# Patient Record
Sex: Female | Born: 2005
Health system: Southern US, Community
[De-identification: ages and names within clinical notes are randomized; demographics above are authoritative.]

## PROBLEM LIST (undated history)

## (undated) DIAGNOSIS — I471 Supraventricular tachycardia, unspecified: Secondary | ICD-10-CM

## (undated) DIAGNOSIS — J45909 Unspecified asthma, uncomplicated: Secondary | ICD-10-CM

## (undated) HISTORY — DX: Unspecified asthma, uncomplicated: J45.909

## (undated) HISTORY — DX: Supraventricular tachycardia, unspecified: I47.10

## (undated) HISTORY — PX: FOOT SURGERY: SHX648

## (undated) HISTORY — PX: WISDOM TOOTH EXTRACTION: SHX21

---
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
## 2023-01-08 ENCOUNTER — Ambulatory Visit (INDEPENDENT_AMBULATORY_CARE_PROVIDER_SITE_OTHER): Payer: 59 | Admitting: Licensed Clinical Social Worker

## 2023-01-08 DIAGNOSIS — F411 Generalized anxiety disorder: Secondary | ICD-10-CM | POA: Diagnosis not present

## 2023-01-08 NOTE — Progress Notes (Signed)
[facility] Counselor/Therapist Progress Note  Patient ID: Cindy Ryan, MRN: [id number]    Date: 01/08/23  Time Spent: 0905  am - 1000 am : 55 Minutes  Treatment Type: Individual Therapy.  Reported Symptoms: Anxiety  Mental Status Exam: Appearance:  Casual     Behavior: Appropriate  Motor: Normal  Speech/Language:  Clear and Coherent  Affect: Appropriate  Mood: normal  Thought process: normal  Thought content:   WNL  Sensory/Perceptual disturbances:   WNL  Orientation: oriented to person, place, time/date, situation, day of week, month of year, and year  Attention: Good  Concentration: Good  Memory: WNL  Fund of knowledge:  Good  Insight:   Good  Judgment:  Good  Impulse Control: Good   Risk Assessment: Danger to Self:  No Self-injurious Behavior: No Danger to Others: No Duty to Warn:no Physical Aggression / Violence:No  Access to Firearms a concern: No  Gang Involvement:No   Subjective:   Cindy Ryan participated from office, located at [employer] with Clinician present. Cindy Ryan consented to treatment.   Cindy Ryan presented for her session and was engaged and insightful. Patient shared her feelings about being home and  how she feels about her father and his unhinged behaviors. Patient reports that she has bottled up emotions and likely trauma from things in her childhood till now. Patient identified that when trying to talk to her family about things they minimize it say that it's the past to get over it. Clinician processed with patient her feelings about that comment and how she can begin to cope with hurt and feeling of being dismissed.    Interventions: Cognitive Behavioral Therapy/Problem Sloving  Diagnosis: Generalized Anxiety Disorder   Plan: Cindy Ryan is to use CBT, mindfulness and coping skills to help manage decrease symptoms associated with their diagnosis.   Long-term goal:   Cindy Ryan will Reduce overall level, frequency, and intensity of the  feelings of anxiety evidenced by decreased irritability, negative self talk, and helpless feelings from 6 to 7 days/week to 0 to 2 days/week per client report for at least 3 consecutive months. Treatment plan to be reviewed by 10/17/2023.  Short-term goal:  Cindy Ryan will verbally express understanding of the relationship between feelings of anxiety and their impact on thinking patterns and behaviors. Verbalize an understanding of the role that distorted thinking plays in creating fears, excessive worry, and ruminations.  Cindy Ryan MSW, LCSW DATE: 01/08/2023

## 2023-01-11 ENCOUNTER — Ambulatory Visit: Payer: 59 | Attending: Student in an Organized Health Care Education/Training Program

## 2023-01-11 DIAGNOSIS — R0602 Shortness of breath: Secondary | ICD-10-CM | POA: Insufficient documentation

## 2023-01-11 DIAGNOSIS — J4520 Mild intermittent asthma, uncomplicated: Secondary | ICD-10-CM | POA: Insufficient documentation

## 2023-01-11 LAB — PULMONARY FUNCTION TEST ARMC ONLY
DL/VA % pred: 50 %
DL/VA: 2.39 ml/min/mmHg/L
DLCO unc % pred: 71 %
DLCO unc: 16.74 ml/min/mmHg
FEF 25-75 Pre: 3.82 L/s
FEF2575-%Pred-Pre: 98 %
FEV1-%Pred-Pre: 115 %
FEV1-Pre: 4.08 L
FEV1FVC-%Pred-Pre: 94 %
FEV6-%Pred-Pre: 122 %
FEV6-Pre: 4.96 L
FEV6FVC-%Pred-Pre: 99 %
FVC-%Pred-Pre: 123 %
FVC-Pre: 4.99 L
Pre FEV1/FVC ratio: 82 %
Pre FEV6/FVC Ratio: 100 %
RV % pred: 121 %
RV: 1.53 L
TLC % pred: 109 %
TLC: 5.88 L

## 2023-01-11 MED ORDER — ALBUTEROL SULFATE (2.5 MG/3ML) 0.083% IN NEBU
2.5000 mg | INHALATION_SOLUTION | Freq: Once | RESPIRATORY_TRACT | Status: AC
  Filled 2023-01-11: qty 3

## 2023-01-12 ENCOUNTER — Encounter: Payer: Self-pay | Admitting: Cardiology

## 2023-01-12 ENCOUNTER — Ambulatory Visit: Payer: 59 | Attending: Cardiology | Admitting: Cardiology

## 2023-01-12 VITALS — BP 118/68 | HR 78 | Ht 66.0 in | Wt 132.2 lb

## 2023-01-12 DIAGNOSIS — R0602 Shortness of breath: Secondary | ICD-10-CM

## 2023-01-12 DIAGNOSIS — R002 Palpitations: Secondary | ICD-10-CM | POA: Diagnosis not present

## 2023-01-12 DIAGNOSIS — I4711 Inappropriate sinus tachycardia, so stated: Secondary | ICD-10-CM

## 2023-01-12 DIAGNOSIS — J45909 Unspecified asthma, uncomplicated: Secondary | ICD-10-CM

## 2023-01-12 DIAGNOSIS — R0789 Other chest pain: Secondary | ICD-10-CM

## 2023-01-12 MED ORDER — PROPRANOLOL HCL 10 MG PO TABS: 10.0000 mg | ORAL_TABLET | Freq: Every day | ORAL | 0 refills | Status: DC | PRN

## 2023-01-12 NOTE — Patient Instructions (Signed)
Medication Instructions:  Your physician has recommended you make the following change in your medication:   STOP - verapamil (CALAN-SR) 120 MG CR tablet  START - propranolol (INDERAL) 10 MG tablet, Daily PRN  *If you need a refill on your cardiac medications before your next appointment, please call your pharmacy*  Lab Work: Your provider would like for you to have following labs drawn today BMET & Magnesium.   If you have labs (blood work) drawn today and your tests are completely normal, you will receive your results only by: MyChart Message (if you have MyChart) OR A paper copy in the mail If you have any lab test that is abnormal or we need to change your treatment, we will call you to review the results.  Testing/Procedures: Your physician has requested that you have an echocardiogram. Echocardiography is a painless test that uses sound waves to create images of your heart. It provides your doctor with information about the size and shape of your heart and how well your heart's chambers and valves are working. This procedure takes approximately one hour. There are no restrictions for this procedure. Please do NOT wear cologne, perfume, aftershave, or lotions (deodorant is allowed). Please arrive 15 minutes prior to your appointment time.   Follow-Up: At [facility], you and your health needs are our priority.  As part of our continuing mission to provide you with exceptional heart care, we have created designated Provider Care Teams.  These Care Teams include your primary Cardiologist (physician) and Advanced Practice Providers (APPs -  Physician Assistants and Nurse Practitioners) who all work together to provide you with the care you need, when you need it.  Your next appointment:   3 month(s)  Provider:   Charlsie Quest, NP    Other Instructions - None

## 2023-01-12 NOTE — Progress Notes (Signed)
Cardiology Office Note    Date:  01/12/2023  ID:  Cindy Ryan, DOB [birth year], MRN [id number] PCP:  Lorre Munroe, NP  Cardiologist:  Debbe Odea, MD  Electrophysiologist:  None   Chief Complaint: Follow up for palpitations.   History of Present Illness: Cindy Ryan is a 17 y.o. female with visit-pertinent history of asthma and palpitations.  She was first seen by Dr. Azucena Cecil on 12/01/22 after being referred by her PCP for palpitations.  She wore a cardiac monitor in 10/2022 that showed an average heart rate of 72 bpm, ranging from 42 bpm to 200 bpm. No significant arrhythmias, no A-fib.  She had rare SVT and ventricular ectopy.  Patient reported that palpitations occasionally were worse with exertion although could occur randomly.  She had noticed heart rates up to 200 bpm.  It was also noted that she has asthma with frequent attacks sometimes exercise-induced.  She was prescribed propranolol by her PCP but patient was worried about taking a beta-blocker with airway disease and frequent asthma attack.  She was started on verapamil 120 mg daily and referred to pulmonary medicine. On 12/14/22 she notified the office of several episodes of slow heart rate, 30-44 bpm with associated fatigue. Medicine was discontinued.   Today she presents for follow-up.  She reports that she is doing well, notes ongoing intermittent palpitations and well as increased heart rate with position changes. Reports one episode lasting for a few seconds with a heart rate of 217 bpm, she is unsure of repeat episodes. She questions if she has POTs, notes she been working to increase her fluid and salt intake. When exercising she notes she has increased palpitations, shortness of breath and neck/chest aching She also endorses intermittent lightheadedness, noted more with position changes.   Labwork independently reviewed: 12/18/22: Hemoglobin 13.4, HCT 40.9  08/21/22: Potassium 4.1, sodium 142, creatinine  0.80 10/31/22: TSH 1.73  ROS: .   Today she denies chest pain, shortness of breath, lower extremity edema, fatigue, palpitations, melena, hematuria, hemoptysis, diaphoresis, weakness, presyncope, syncope, orthopnea, and PND.  All other systems are reviewed and otherwise negative. Studies Reviewed: Marland Kitchen    EKG:  EKG is not ordered today.   CV Studies:  Cardiac Studies & Procedures         MONITORS  LONG TERM MONITOR (3-14 DAYS) 11/21/2022  Narrative HR 42 - 200, average 72 bpm. Rare supraventricular and ventricular ectopy. No atrial fibrillation. No sustained arrhythmias.  Sheria Lang T. Lalla Brothers, MD, [facility], [facility] Cardiac Electrophysiology            Current Reported Medications:.    Current Meds  Medication Sig   Adapalene (DIFFERIN) 0.3 % gel Apply 1 application  topically at bedtime.   albuterol (VENTOLIN HFA) 108 (90 Base) MCG/ACT inhaler Inhale 2 puffs into the lungs every 6 (six) hours as needed.   budesonide-formoterol (SYMBICORT) 80-4.5 MCG/ACT inhaler Inhale 2 puffs into the lungs every 6 (six) hours as needed.   clindamycin (CLEOCIN-T) 1 % lotion Apply to face every morning for acne.   Dapsone (ACZONE) 7.5 % GEL Apply to face every morning.   fluticasone (FLONASE) 50 MCG/ACT nasal spray Place 2 sprays into both nostrils daily.   Fluticasone Furoate (ARNUITY ELLIPTA) 50 MCG/ACT AEPB Inhale 1 puff into the lungs daily at 12 noon.   montelukast (SINGULAIR) 10 MG tablet Take 1 tablet (10 mg total) by mouth at bedtime.   tretinoin (RETIN-A) 0.05 % cream Apply a pea-sized amount to  face nightly as tolerated.   Physical Exam:   VS:  BP 118/68 (BP Location: Left Arm, Patient Position: Sitting, Cuff Size: Normal)   Pulse 78   Ht 5\' 6"  (1.676 m)   Wt 132 lb 3.2 oz (60 kg)   LMP 12/18/2022 (Exact Date)   SpO2 100%   BMI 21.34 kg/m    Wt Readings from Last 3 Encounters:  01/12/23 132 lb 3.2 oz (60 kg) (59%, Z= 0.22)*  12/18/22 133 lb (60.3 kg) (60%, Z= 0.27)*  12/01/22 129 lb  9.6 oz (58.8 kg) (55%, Z= 0.12)*   * Growth percentiles are based on CDC (Girls, 2-20 Years) data.    GEN: Well nourished, well developed in no acute distress NECK: No JVD; No carotid bruits CARDIAC: RRR, no murmurs, rubs, gallops RESPIRATORY:  Clear to auscultation without rales, wheezing or rhonchi  ABDOMEN: Soft, non-tender, non-distended EXTREMITIES:  No edema; No acute deformity   Asessement and Plan:.   Palpitations/inappropriate sinus tachycardia/lightheadedness: She wore a cardiac monitor in 10/2022 that showed average heart rate of 72 bpm, ranging from 42 bpm to 200 bpm. No significant arrhythmias, no A-fib.  She had rare SVT and ventricular ectopy. She was started on verapamil 120 mg daily however experience episodes of bradycardia in the 30's to 40's, verapamil was discontinued. Today reports ongoing intermittent palpitations, notes short episode of heart rate 217 bpm, lasting a few seconds. Notes intermittent palpitations associated with increased fatigue and lightheadedness. She is concerned she has POTs. Orthostatics today show stable blood pressure, she did have increase in her heart rate from 76 bpm while laying to 102 bpm with standing. Discussed that with bradycardia on verapamil and her history of asthma preventing use of daily beta blocker use medication to control palpitations is difficult. As she does not have sustained palpitations recommended she only take propanolol 10 mg as needed daily for sustained palpitations. Encouraged decreased to no caffeine intake, increased fluid and electrolyte intake. Encouraged compression stockings and consideration of abdominal binder. Check BMET and Mag today. Check echocardiogram, see #2.  Orthostatic VS for the past 24 hrs (Last 3 readings):  BP- Lying Pulse- Lying BP- Sitting Pulse- Sitting BP- Standing at 0 minutes Pulse- Standing at 0 minutes BP- Standing at 3 minutes Pulse- Standing at 3 minutes  01/12/23 1422 110/73 76 122/86 69 112/73  97 113/78 102     Chest discomfort/shortness of breath: Patient reports increased neck/chest discomfort with palpitations while exercising resulting in increased shortness of breath. Recommendations at noted above. Will also check echocardiogram to assess heart structure and function.   Asthma: Seen by Dr. Aundria Rud on 12/18/22. She had PFTs completed yesterday, results not yet available.   Disposition: F/u with APP in 3 months or sooner if needed.   Signed, Rip Harbour, NP

## 2023-01-13 LAB — BASIC METABOLIC PANEL
BUN/Creatinine Ratio: 19 (ref 9–23)
BUN: 14 mg/dL (ref 6–20)
CO2: 24 mmol/L (ref 20–29)
Calcium: 9.6 mg/dL (ref 8.7–10.2)
Chloride: 102 mmol/L (ref 96–106)
Creatinine, Ser: 0.75 mg/dL (ref 0.57–1.00)
Glucose: 101 mg/dL — ABNORMAL HIGH (ref 70–99)
Potassium: 4.2 mmol/L (ref 3.5–5.2)
Sodium: 139 mmol/L (ref 134–144)
eGFR: 118 mL/min/{1.73_m2} (ref >59)

## 2023-01-13 LAB — MAGNESIUM: Magnesium: 2.1 mg/dL (ref 1.6–2.3)

## 2023-01-22 ENCOUNTER — Ambulatory Visit: Payer: 59 | Admitting: Licensed Clinical Social Worker

## 2023-01-23 ENCOUNTER — Other Ambulatory Visit: Payer: Self-pay | Admitting: Cardiology

## 2023-01-23 DIAGNOSIS — R0602 Shortness of breath: Secondary | ICD-10-CM

## 2023-01-23 DIAGNOSIS — R002 Palpitations: Secondary | ICD-10-CM

## 2023-01-23 DIAGNOSIS — R0789 Other chest pain: Secondary | ICD-10-CM

## 2023-01-23 DIAGNOSIS — I4711 Inappropriate sinus tachycardia, so stated: Secondary | ICD-10-CM

## 2023-01-23 DIAGNOSIS — J45909 Unspecified asthma, uncomplicated: Secondary | ICD-10-CM

## 2023-01-25 ENCOUNTER — Encounter: Payer: Self-pay | Admitting: Student in an Organized Health Care Education/Training Program

## 2023-01-25 ENCOUNTER — Ambulatory Visit (INDEPENDENT_AMBULATORY_CARE_PROVIDER_SITE_OTHER): Payer: 59 | Admitting: Student in an Organized Health Care Education/Training Program

## 2023-01-25 VITALS — BP 90/60 | HR 60 | Temp 97.6°F | Ht 66.0 in | Wt 132.0 lb

## 2023-01-25 DIAGNOSIS — J4520 Mild intermittent asthma, uncomplicated: Secondary | ICD-10-CM

## 2023-01-25 LAB — NITRIC OXIDE: Nitric Oxide: 35

## 2023-01-25 NOTE — Progress Notes (Signed)
Synopsis: Referred in for asthma by Lorre Munroe, NP  Assessment & Plan:   #Mild Intermittent Asthma  Presents for follow up of asthma with a history of exercise induced asthma and previous exacerbations. Since our visit, we've started PRN ICS/LABA and she's used it a few times to very good effect. Allergen testing shows multiple allergies, suggesting t-helper cell type 2 response. No eosinophilia noted on CBC w/ differential. PFT's were overall normal and re-assuring. FENO today stable at 35 ppb. Will continue current treatment as is, and asked the patient to call us should she start using the Symbicort frequently.   Return in about 1 year (around 01/25/2024).  I spent 30 minutes caring for this patient today, including preparing to see the patient, obtaining a medical history , reviewing a separately obtained history, performing a medically appropriate examination and/or evaluation, counseling and educating the patient/family/caregiver, and documenting clinical information in the electronic health record  Raechel Chute, MD [facility] 01/25/2023 5:27 PM    End of visit medications:  No orders of the defined types were placed in this encounter.    Current Outpatient Medications:    Adapalene (DIFFERIN) 0.3 % gel, Apply 1 application  topically at bedtime., Disp: 45 g, Rfl: 5   albuterol (VENTOLIN HFA) 108 (90 Base) MCG/ACT inhaler, Inhale 2 puffs into the lungs every 6 (six) hours as needed., Disp: 8 g, Rfl: 2   clindamycin (CLEOCIN-T) 1 % lotion, Apply to face every morning for acne., Disp: 60 mL, Rfl: 5   Dapsone (ACZONE) 7.5 % GEL, Apply to face every morning., Disp: 90 g, Rfl: 5   fluticasone (FLONASE) 50 MCG/ACT nasal spray, Place 2 sprays into both nostrils daily., Disp: 16 g, Rfl: 5   montelukast (SINGULAIR) 10 MG tablet, Take 1 tablet (10 mg total) by mouth at bedtime., Disp: 90 tablet, Rfl: 1   propranolol (INDERAL) 10 MG tablet, Take 1 tablet (10 mg  total) by mouth daily as needed., Disp: 90 tablet, Rfl: 0   tretinoin (RETIN-A) 0.05 % cream, Apply a pea-sized amount to face nightly as tolerated., Disp: 45 g, Rfl: 3 No current facility-administered medications for this visit.  Facility-Administered Medications Ordered in Other Visits:    albuterol (PROVENTIL) (2.5 MG/3ML) 0.083% nebulizer solution 2.5 mg, 2.5 mg, Nebulization, Once, Raechel Chute, MD   Subjective:   PATIENT ID: Cindy Ryan GENDER: female DOB: 07-03-2005, MRN: [id number]  Chief Complaint  Patient presents with   Follow-up    No shortness of breath, cough or wheezing.     HPI  Patient is a pleasant 17 year old female presenting to clinic for follow up of asthma.  She feels well on follow up. No shortness of breath, no cough, no wheeze, and no chest tightness. She reports some symptoms of a cough a few weeks ago that responded perfectly to Symbicort PRN. She's had her PFT's and her allergen testing and is here to discuss results.   Patient reports having had symptoms of asthma since the age of 86.  She was previously managed with as needed albuterol and was given the diagnosis of exercise-induced asthma.  She has had multiple exacerbations requiring the use of prednisone in the past. In between exacerbations, she does not have symptoms of cough, sputum production, or chest tightness. She is currently asymptomatic and in her usual state of health.  Her asthma symptoms mostly present as shortness of breath as well as a sensation of chest tightness.  She does have seasonal allergies  and does take an over-the-counter Claritin for that.  She does not have any fevers, chills, night sweats, weight loss, or other constitutional symptoms.   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  She lives at home with her parents.  They have 3 dogs and 1 cat.  There is carpet in the bedroom. She denies smoking or vaping. Dogs enter the bedroom, but the cat does not.  Ancillary information  including prior medications, full medical/surgical/family/social histories, and PFTs (when available) are listed below and have been reviewed.   Review of Systems  Constitutional:  Negative for chills, fever and weight loss.  Respiratory:  Negative for cough, hemoptysis, sputum production, shortness of breath and wheezing.   Cardiovascular:  Negative for chest pain, palpitations and orthopnea.     Objective:   Vitals:   01/25/23 1428  BP: 90/60  Pulse: 60  Temp: 97.6 F (36.4 C)  TempSrc: Temporal  SpO2: 97%  Weight: 132 lb (59.9 kg)  Height: 5\' 6"  (1.676 m)   97% on RA  BMI Readings from Last 3 Encounters:  01/25/23 21.31 kg/m (46%, Z= -0.10)*  01/12/23 21.34 kg/m (46%, Z= -0.09)*  12/18/22 21.47 kg/m (48%, Z= -0.04)*   * Growth percentiles are based on CDC (Girls, 2-20 Years) data.   Wt Readings from Last 3 Encounters:  01/25/23 132 lb (59.9 kg) (58%, Z= 0.21)*  01/12/23 132 lb 3.2 oz (60 kg) (59%, Z= 0.22)*  12/18/22 133 lb (60.3 kg) (60%, Z= 0.27)*   * Growth percentiles are based on CDC (Girls, 2-20 Years) data.    Physical Exam Constitutional:      Appearance: Normal appearance.  HENT:     Head: Normocephalic and atraumatic.  Cardiovascular:     Rate and Rhythm: Normal rate and regular rhythm.     Pulses: Normal pulses.     Heart sounds: Normal heart sounds.  Pulmonary:     Effort: Pulmonary effort is normal. No respiratory distress.     Breath sounds: Normal breath sounds. No wheezing, rhonchi or rales.  Neurological:     General: No focal deficit present.     Mental Status: She is alert and oriented to person, place, and time. Mental status is at baseline.       Ancillary Information    Past Medical History:  Diagnosis Date   Asthma    SVT (supraventricular tachycardia) (HCC)      Family History  Problem Relation Age of Onset   Healthy Mother    Hypertension Father    Healthy Father    Healthy Sister    Healthy Sister    Dementia  Maternal Grandmother      Past Surgical History:  Procedure Laterality Date   FOOT SURGERY Right    WISDOM TOOTH EXTRACTION      Social History   Socioeconomic History   Marital status: Single    Spouse name: Not on file   Number of children: Not on file   Years of education: Not on file   Highest education level: GED or equivalent  Occupational History   Not on file  Tobacco Use   Smoking status: Never   Smokeless tobacco: Never  Vaping Use   Vaping status: Never Used  Substance and Sexual Activity   Alcohol use: Never   Drug use: Never   Sexual activity: Not on file  Other Topics Concern   Not on file  Social History Narrative   Not on file   Social Determinants of  Health   Financial Resource Strain: Low Risk  (09/06/2022)   Overall Financial Resource Strain (CARDIA)    Difficulty of Paying Living Expenses: Not hard at all  Food Insecurity: No Food Insecurity (09/06/2022)   Hunger Vital Sign    Worried About Running Out of Food in the Last Year: Never true    Ran Out of Food in the Last Year: Never true  Transportation Needs: No Transportation Needs (09/06/2022)   PRAPARE - Administrator, Civil Service (Medical): No    Lack of Transportation (Non-Medical): No  Physical Activity: Sufficiently Active (09/06/2022)   Exercise Vital Sign    Days of Exercise per Week: 4 days    Minutes of Exercise per Session: 50 min  Stress: Stress Concern Present (09/06/2022)   [employer] of Occupational Health - Occupational Stress Questionnaire    Feeling of Stress : To some extent  Social Connections: Socially Isolated (09/06/2022)   Social Connection and Isolation Panel [NHANES]    Frequency of Communication with Friends and Family: Once a week    Frequency of Social Gatherings with Friends and Family: Never    Attends Religious Services: More than 4 times per year    Active Member of Clubs or Organizations: No    Attends Banker Meetings: Not on  file    Marital Status: Never married  Intimate Partner Violence: Not At Risk (10/14/2021)   Received from [facility], [facility]   [facility] Domestic Violence    Do you feel threatened or afraid of others close to you?: No     No Known Allergies   CBC    Component Value Date/Time   WBC 4.9 12/18/2022 1140   RBC 4.63 12/18/2022 1140   HGB 13.4 12/18/2022 1140   HGB 13.4 08/21/2022 1518   HCT 40.9 12/18/2022 1140   HCT 40.9 08/21/2022 1518   PLT 200 12/18/2022 1140   PLT 230 08/21/2022 1518   MCV 88.3 12/18/2022 1140   MCV 87 08/21/2022 1518   MCH 28.9 12/18/2022 1140   MCHC 32.8 12/18/2022 1140   RDW 12.4 12/18/2022 1140   RDW 12.5 08/21/2022 1518   LYMPHSABS 2.0 12/18/2022 1140   MONOABS 0.3 12/18/2022 1140   EOSABS 0.1 12/18/2022 1140   BASOSABS 0.1 12/18/2022 1140    Pulmonary Functions Testing Results:    Latest Ref Rng & Units 01/11/2023   11:32 AM  PFT Results  FVC-Pre L 4.99   FVC-Predicted Pre % 123   Pre FEV1/FVC % % 82   FEV1-Pre L 4.08   FEV1-Predicted Pre % 115   DLCO uncorrected ml/min/mmHg 16.74   DLCO UNC% % 71   DLVA Predicted % 50   TLC L 5.88   TLC % Predicted % 109   RV % Predicted % 121     Outpatient Medications Prior to Visit  Medication Sig Dispense Refill   Adapalene (DIFFERIN) 0.3 % gel Apply 1 application  topically at bedtime. 45 g 5   albuterol (VENTOLIN HFA) 108 (90 Base) MCG/ACT inhaler Inhale 2 puffs into the lungs every 6 (six) hours as needed. 8 g 2   clindamycin (CLEOCIN-T) 1 % lotion Apply to face every morning for acne. 60 mL 5   Dapsone (ACZONE) 7.5 % GEL Apply to face every morning. 90 g 5   fluticasone (FLONASE) 50 MCG/ACT nasal spray Place 2 sprays into both nostrils daily. 16 g 5   montelukast (SINGULAIR) 10 MG tablet Take 1 tablet (  10 mg total) by mouth at bedtime. 90 tablet 1   propranolol (INDERAL) 10 MG tablet Take 1 tablet (10 mg total) by mouth daily as needed. 90 tablet 0   tretinoin (RETIN-A) 0.05 % cream  Apply a pea-sized amount to face nightly as tolerated. 45 g 3   budesonide-formoterol (SYMBICORT) 80-4.5 MCG/ACT inhaler Inhale 2 puffs into the lungs every 6 (six) hours as needed. (Patient not taking: Reported on 01/25/2023) 1 each 3   Fluticasone Furoate (ARNUITY ELLIPTA) 50 MCG/ACT AEPB Inhale 1 puff into the lungs daily at 12 noon. (Patient not taking: Reported on 01/25/2023) 30 each 5   Facility-Administered Medications Prior to Visit  Medication Dose Route Frequency Provider Last Rate Last Admin   albuterol (PROVENTIL) (2.5 MG/3ML) 0.083% nebulizer solution 2.5 mg  2.5 mg Nebulization Once Raechel Chute, MD

## 2023-02-01 ENCOUNTER — Ambulatory Visit: Payer: 59 | Attending: Cardiology

## 2023-02-01 DIAGNOSIS — R0602 Shortness of breath: Secondary | ICD-10-CM

## 2023-02-01 DIAGNOSIS — I4711 Inappropriate sinus tachycardia, so stated: Secondary | ICD-10-CM

## 2023-02-01 DIAGNOSIS — R0789 Other chest pain: Secondary | ICD-10-CM

## 2023-02-02 ENCOUNTER — Telehealth: Payer: Self-pay

## 2023-02-02 LAB — ECHOCARDIOGRAM COMPLETE
AR max vel: 2.85 cm2
AV Area VTI: 2.91 cm2
AV Area mean vel: 2.79 cm2
AV Mean grad: 3 mm[Hg]
AV Peak grad: 6.2 mm[Hg]
Ao pk vel: 1.24 m/s
Area-P 1/2: 2.95 cm2
Calc EF: 53.5 %
S' Lateral: 3.5 cm
Single Plane A2C EF: 54 %
Single Plane A4C EF: 53.2 %

## 2023-02-02 NOTE — Telephone Encounter (Signed)
-----   Message from Rip Harbour sent at 02/02/2023  3:55 PM EST ----- Please let Cindy Ryan know that her her heart squeeze and function is normal.  There were no significant valvular abnormalities on her echo, there is slight leaking of the mitral valve.  This would not cause her symptoms.  Overall reassuring study.  Follow-up as planned.

## 2023-02-02 NOTE — Telephone Encounter (Signed)
Left message to call back  

## 2023-02-05 ENCOUNTER — Other Ambulatory Visit: Payer: Self-pay | Admitting: Internal Medicine

## 2023-02-05 NOTE — Telephone Encounter (Signed)
Patient is returning call to discuss echo results. °

## 2023-02-05 NOTE — Telephone Encounter (Signed)
Reviewed results with patient and she verbalized understanding with no further questions at this time.  

## 2023-02-06 NOTE — Telephone Encounter (Signed)
Requested Prescriptions  Pending Prescriptions Disp Refills   montelukast (SINGULAIR) 10 MG tablet [Pharmacy Med Name: MONTELUKAST 10MG  TABLETS] 90 tablet 1    Sig: TAKE 1 TABLET(10 MG) BY MOUTH AT BEDTIME     Pulmonology:  Leukotriene Inhibitors Passed - 02/05/2023  9:08 AM      Passed - Valid encounter within last 12 months    Recent Outpatient Visits           3 months ago Palpitations   [place] [facility] [place], Salvadore Oxford, NP   5 months ago Anxiety and depression   [place] [facility] [place], Salvadore Oxford, NP   6 months ago Encounter for general adult medical examination with abnormal findings   [place] [facility] [place], Salvadore Oxford, NP       Future Appointments             In 2 months Charlsie Quest, NP [facility] [place]   In 6 months [place], Salvadore Oxford, NP [facility]   In 7 months Willeen Niece, MD [facility]

## 2023-02-20 ENCOUNTER — Ambulatory Visit: Payer: Self-pay | Admitting: Internal Medicine

## 2023-03-22 ENCOUNTER — Other Ambulatory Visit: Payer: Self-pay | Admitting: Internal Medicine

## 2023-03-26 NOTE — Telephone Encounter (Signed)
 Requested Prescriptions  Pending Prescriptions Disp Refills   fluticasone  (FLONASE ) 50 MCG/ACT nasal spray [Pharmacy Med Name: FLUTICASONE  NASAL SP (120) RX] 16 g 5    Sig: SHAKE LIQUID AND USE 2 SPRAYS IN EACH NOSTRIL DAILY     Ear, Nose, and Throat: Nasal Preparations - Corticosteroids Passed - 03/26/2023  9:59 AM      Passed - Valid encounter within last 12 months    Recent Outpatient Visits           4 months ago Palpitations   [place] [facility] [place], Angeline ORN, NP   6 months ago Anxiety and depression   [place] [facility] [place], Angeline ORN, NP   7 months ago Encounter for general adult medical examination with abnormal findings   [place] [facility] [place], Angeline ORN, NP       Future Appointments             In 3 weeks Gerard Frederick, NP [facility] [place]   In 4 months [place], Angeline ORN, NP [place] [facility]   In 5 months Jackquline Sawyer, MD [facility]

## 2023-04-17 ENCOUNTER — Encounter: Payer: Self-pay | Admitting: Cardiology

## 2023-04-17 ENCOUNTER — Ambulatory Visit: Payer: BC Managed Care – PPO | Attending: Cardiology | Admitting: Cardiology

## 2023-04-17 VITALS — BP 100/68 | HR 67 | Ht 66.0 in | Wt 135.8 lb

## 2023-04-17 DIAGNOSIS — J4520 Mild intermittent asthma, uncomplicated: Secondary | ICD-10-CM

## 2023-04-17 DIAGNOSIS — R002 Palpitations: Secondary | ICD-10-CM

## 2023-04-17 DIAGNOSIS — R0789 Other chest pain: Secondary | ICD-10-CM

## 2023-04-17 DIAGNOSIS — I4711 Inappropriate sinus tachycardia, so stated: Secondary | ICD-10-CM | POA: Diagnosis not present

## 2023-04-17 DIAGNOSIS — R0602 Shortness of breath: Secondary | ICD-10-CM | POA: Diagnosis not present

## 2023-04-17 NOTE — Progress Notes (Signed)
Cardiology Office Note:  .   Date:  04/17/2023  ID:  Cindy Ryan, DOB 04-07-2005, MRN [id number] PCP: Cindy Munroe, NP  [facility] Cardiologist:  Debbe Odea, MD    History of Present Illness: Cindy Ryan is a 18 y.o. female with past medical history of palpitations, inappropriate sinus tachycardia, uncomplicated asthma, who is here today for follow-up.   Was first evaluated in clinic 12/01/2022 by Dr.Agbor-Etang after being referred by her PCP for palpitations.  She wore cardiac monitor 10/2022 which showed an average heart rate of 73 bpm ranging from 42 bpm to 200 bpm.  No significant arrhythmias, no atrial fibrillation.  She had previous VT and ventricular ectopy.  She reported palpitations occasionally worse with exertion that could occur randomly.  She did notice heart rates up to 200 bpm was noted that she had asthma with frequent attacks symptoms exercise-induced.  She was prescribed propranolol by her PCP but the patient was worried about taking beta-blocker therapy with having airway disease and frequent asthma attacks.  She was started on verapamil 120 mg daily referred to pulmonary medicine.  On 04/14/2022 she notify the office of several episodes of slow heart rate 30-44 bpm with associated fatigue.  Subsequently the medication had to be discontinued.   She was last seen in clinic 01/12/2023 and reported that she had been doing well with continued ongoing intermittent palpitations as well as increased heart rate with position changing.  It was questionable whether she had other symptoms of POTS and noted she had been working on increasing her fluid and sodium intake.  When exercising she noted increased palpitations and shortness of breath as well as chest tightness.  She was scheduled for follow-up labs and an echocardiogram.  She returns to clinic today accompanied by family member.  She reports that she has been doing well but notes occasional intermittent  palpitations.  States that she has been doing well on her current medication without any adverse side effects.  States that she does take medication prior to working out 1-2 hours.  She is working on increasing her fluid and sodium intake as she does little less than a gallon of water per day.  She has been doing dehydration water packets.  She denies chest pain chest tightness, shortness of breath, or peripheral edema.  Denies any hospitalizations or visits to the emergency department.  ROS: 10 point review of systems is reviewed and considered negative except what is been listed in the HPI  Studies Reviewed: .       2D echo 02/01/2023 1. Left ventricular ejection fraction, by estimation, is 55 to 60%. The  left ventricle has normal function. The left ventricle has no regional  wall motion abnormalities. Left ventricular diastolic parameters were  normal.   2. Right ventricular systolic function is normal. The right ventricular  size is normal.   3. The mitral valve is normal in structure. Mild mitral valve  regurgitation. No evidence of mitral stenosis.   4. The aortic valve has an indeterminant number of cusps. Aortic valve  regurgitation is not visualized. No aortic stenosis is present.   5. The inferior vena cava is normal in size with greater than 50%  respiratory variability, suggesting right atrial pressure of 3 mmHg.  Risk Assessment/Calculations:             Physical Exam:   VS:  BP 100/68   Pulse 67   Ht 5\' 6"  (1.676 m)  Wt 135 lb 12.8 oz (61.6 kg)   SpO2 100%   BMI 21.92 kg/m    Wt Readings from Last 3 Encounters:  04/17/23 135 lb 12.8 oz (61.6 kg) (64%, Z= 0.35)*  01/25/23 132 lb (59.9 kg) (58%, Z= 0.21)*  01/12/23 132 lb 3.2 oz (60 kg) (59%, Z= 0.22)*   * Growth percentiles are based on CDC (Girls, 2-20 Years) data.    GEN: Well nourished, well developed in no acute distress NECK: No JVD; No carotid bruits CARDIAC: RRR, no murmurs, rubs, gallops RESPIRATORY:   Clear to auscultation without rales, wheezing or rhonchi  ABDOMEN: Soft, non-tender, non-distended EXTREMITIES:  No edema; No deformity   ASSESSMENT AND PLAN: .   Palpitations/inappropriate sinus tachycardia where she previously wore a cardiac monitor in 8/21 showed average heart rate of 72 bpm without significant arrhythmia.  She had rare SVT and ventricular ectopy.  She was started on verapamil to 20 mg daily after experiencing episodes of bradycardia in the past.  Had to be discontinued.  With her intermittent palpitations she has been continued on propranolol 10 mg daily as needed.  Symptoms have been well-controlled as she takes medication prior to she works out.  She continues to have a small amount of caffeine in the morning with coffee but has not increased her fluid and electrolyte intake.  Continue with compression therapy as needed.  Prior echocardiogram was completed which revealed an LVEF 55 to 60%, no RWMA, mild mitral valve regurgitation.  Unfortunately the aortic valve was reviewed and undetermined amount of cusp.   Atypical chest discomfort and shortness of breath that she states has improved since taking her propranolol prior to working out.  Asthma where she states that she had recently had her inhalers changed and symptoms have improved.  Encouraged to continue to follow with pulmonary.       Dispo: Patient to follow-up with MD/APP in 3 to 4 months or sooner for reevaluation of symptoms.  Signed, Antero Derosia, NP

## 2023-04-17 NOTE — Patient Instructions (Signed)
Medication Instructions:   Your physician recommends that you continue on your current medications as directed. Please refer to the Current Medication list given to you today.' *If you need a refill on your cardiac medications before your next appointment, please call your pharmacy*   Lab Work:  NONE TODAY  If you have labs (blood work) drawn today and your tests are completely normal, you will receive your results only by: MyChart Message (if you have MyChart) OR A paper copy in the mail If you have any lab test that is abnormal or we need to change your treatment, we will call you to review the results.   Testing/Procedures:  NONE TODAY   Follow-Up: At [facility], you and your health needs are our priority.  As part of our continuing mission to provide you with exceptional heart care, we have created designated Provider Care Teams.  These Care Teams include your primary Cardiologist (physician) and Advanced Practice Providers (APPs -  Physician Assistants and Nurse Practitioners) who all work together to provide you with the care you need, when you need it.  We recommend signing up for the patient portal called "MyChart".  Sign up information is provided on this After Visit Summary.  MyChart is used to connect with patients for Virtual Visits (Telemedicine).  Patients are able to view lab/test results, encounter notes, upcoming appointments, etc.  Non-urgent messages can be sent to your provider as well.   To learn more about what you can do with MyChart, go to [email or web address].    Your next appointment:   3-4 month(s)  Provider:   You may see Debbe Odea, MD or one of the following Advanced Practice Providers on your designated Care Team:   Nicolasa Ducking, NP Eula Listen, PA-C Cadence Fransico Michael, PA-C Charlsie Quest, NP Carlos Levering, NP

## 2023-05-21 ENCOUNTER — Other Ambulatory Visit: Payer: Self-pay | Admitting: Cardiology

## 2023-05-21 DIAGNOSIS — R002 Palpitations: Secondary | ICD-10-CM

## 2023-06-01 ENCOUNTER — Ambulatory Visit (INDEPENDENT_AMBULATORY_CARE_PROVIDER_SITE_OTHER): Admitting: Internal Medicine

## 2023-06-01 ENCOUNTER — Encounter: Payer: Self-pay | Admitting: Internal Medicine

## 2023-06-01 VITALS — BP 108/68 | Ht 66.0 in | Wt 130.8 lb

## 2023-06-01 DIAGNOSIS — M546 Pain in thoracic spine: Secondary | ICD-10-CM

## 2023-06-01 MED ORDER — NAPROXEN 375 MG PO TABS: 375.0000 mg | ORAL_TABLET | Freq: Two times a day (BID) | ORAL | 0 refills | Status: DC

## 2023-06-01 MED ORDER — METHOCARBAMOL 500 MG PO TABS
500.0000 mg | ORAL_TABLET | Freq: Three times a day (TID) | ORAL | 0 refills | Status: DC | PRN
Start: 2023-06-01 — End: 2023-08-06

## 2023-06-01 NOTE — Progress Notes (Signed)
 Subjective:    Patient ID: Cindy Ryan, female    DOB: [birth year], 18 y.o.   MRN: [id number]  HPI  Discussed the use of AI scribe software for clinical note transcription with the patient, who gave verbal consent to proceed.   Cindy Ryan is a 18 year old female who presents with back pain.  She has been experiencing back pain for the past few weeks, which she attributes to work-related activities. The pain is located in the middle of her back, below the shoulder blades, and radiates to the sides. It is described as both sore and achy, as well as sharp and stabbing. She has not had any prior back injuries or surgeries and has only taken ibuprofen for the pain.  She experiences numbness in her legs but denies any symptoms in her arms. Additionally, she reports 'shooting pains' in her back and notes that her back 'keeps constantly cracking' with movement.  She engages in heavy lifting at her job, which may contribute to her symptoms. There is a family history of back problems, as her father has degenerative back issues and arthritis.       Review of Systems   Past Medical History:  Diagnosis Date   Asthma    SVT (supraventricular tachycardia) (HCC)     Current Outpatient Medications  Medication Sig Dispense Refill   Adapalene (DIFFERIN) 0.3 % gel Apply 1 application  topically at bedtime. 45 g 5   albuterol (VENTOLIN HFA) 108 (90 Base) MCG/ACT inhaler Inhale 2 puffs into the lungs every 6 (six) hours as needed. 8 g 2   clindamycin (CLEOCIN-T) 1 % lotion Apply to face every morning for acne. 60 mL 5   Dapsone (ACZONE) 7.5 % GEL Apply to face every morning. 90 g 5   fluticasone (FLONASE) 50 MCG/ACT nasal spray SHAKE LIQUID AND USE 2 SPRAYS IN EACH NOSTRIL DAILY 16 g 5   montelukast (SINGULAIR) 10 MG tablet TAKE 1 TABLET(10 MG) BY MOUTH AT BEDTIME 90 tablet 1   propranolol (INDERAL) 10 MG tablet Take 1 tablet (10 mg total) by mouth daily as needed. 90 tablet 3   tretinoin (RETIN-A)  0.05 % cream Apply a pea-sized amount to face nightly as tolerated. 45 g 3   No current facility-administered medications for this visit.   Facility-Administered Medications Ordered in Other Visits  Medication Dose Route Frequency Provider Last Rate Last Admin   albuterol (PROVENTIL) (2.5 MG/3ML) 0.083% nebulizer solution 2.5 mg  2.5 mg Nebulization Once Raechel Chute, MD        No Known Allergies  Family History  Problem Relation Age of Onset   Healthy Mother    Hypertension Father    Healthy Father    Healthy Sister    Healthy Sister    Dementia Maternal Grandmother     Social History   Socioeconomic History   Marital status: Single    Spouse name: Not on file   Number of children: Not on file   Years of education: Not on file   Highest education level: GED or equivalent  Occupational History   Not on file  Tobacco Use   Smoking status: Never   Smokeless tobacco: Never  Vaping Use   Vaping status: Never Used  Substance and Sexual Activity   Alcohol use: Never   Drug use: Never   Sexual activity: Not on file  Other Topics Concern   Not on file  Social History Narrative   Not on file   Social  Drivers of Health   Financial Resource Strain: Low Risk  (09/06/2022)   Overall Financial Resource Strain (CARDIA)    Difficulty of Paying Living Expenses: Not hard at all  Food Insecurity: No Food Insecurity (09/06/2022)   Hunger Vital Sign    Worried About Running Out of Food in the Last Year: Never true    Ran Out of Food in the Last Year: Never true  Transportation Needs: No Transportation Needs (09/06/2022)   PRAPARE - Administrator, Civil Service (Medical): No    Lack of Transportation (Non-Medical): No  Physical Activity: Sufficiently Active (09/06/2022)   Exercise Vital Sign    Days of Exercise per Week: 4 days    Minutes of Exercise per Session: 50 min  Stress: Stress Concern Present (09/06/2022)   [employer] of Occupational Health -  Occupational Stress Questionnaire    Feeling of Stress : To some extent  Social Connections: Socially Isolated (09/06/2022)   Social Connection and Isolation Panel [NHANES]    Frequency of Communication with Friends and Family: Once a week    Frequency of Social Gatherings with Friends and Family: Never    Attends Religious Services: More than 4 times per year    Active Member of [employer] or Organizations: No    Attends Banker Meetings: Not on file    Marital Status: Never married  Intimate Partner Violence: Not At Risk (10/14/2021)   [facility] ED Domestic Violence    Do you feel threatened or afraid of others close to you?: No     Constitutional: Denies fever, malaise, fatigue, headache or abrupt weight changes.  Respiratory: Denies difficulty breathing, shortness of breath, cough or sputum production.   Cardiovascular: Denies chest pain, chest tightness, palpitations or swelling in the hands or feet.  Gastrointestinal: Denies abdominal pain, bloating, constipation, diarrhea or blood in the stool.  GU: Denies urgency, frequency, pain with urination, burning sensation, blood in urine, odor or discharge. Musculoskeletal: Patient reports mid back pain.  Denies decrease in range of motion, difficulty with gait, or joint swelling.  Skin: Denies redness, rashes, lesions or ulcercations.  Neurological: Patient reports intermittent paresthesia of legs.  Denies dizziness, difficulty with memory, difficulty with speech or problems with balance and coordination.   No other specific complaints in a complete review of systems (except as listed in HPI above).      Objective:   Physical Exam BP 108/68 (BP Location: Left Arm, Patient Position: Sitting, Cuff Size: Normal)   Ht 5\' 6"  (1.676 m)   Wt 130 lb 12.8 oz (59.3 kg)   LMP 05/21/2023 (Approximate)   BMI 21.11 kg/m   Wt Readings from Last 3 Encounters:  04/17/23 135 lb 12.8 oz (61.6 kg)  (64%, Z= 0.35)*  01/25/23 132 lb (59.9 kg) (58%, Z= 0.21)*  01/12/23 132 lb 3.2 oz (60 kg) (59%, Z= 0.22)*   * Growth percentiles are based on CDC (Girls, 2-20 Years) data.    General: Appears her stated age, well developed, well nourished in NAD. Skin: Warm, dry and intact. No rashes noted. Cardiovascular: Normal rate. Pulmonary/Chest: Normal effort and positive vesicular breath sounds. No respiratory distress. No wheezes, rales or ronchi noted.  Musculoskeletal: Normal flexion, extension, rotation lateral bending of the spine.  Bony tenderness noted over the thoracic spine and bilateral paraspinal muscles.  Shoulder shrug equal.  Strength 5/5 BUE/BLE.  No difficulty with gait.  Neurological: Alert and oriented. Coordination normal.  BMET    Component Value Date/Time   NA 139 01/12/2023 1507   K 4.2 01/12/2023 1507   CL 102 01/12/2023 1507   CO2 24 01/12/2023 1507   GLUCOSE 101 (H) 01/12/2023 1507   BUN 14 01/12/2023 1507   CREATININE 0.75 01/12/2023 1507   CALCIUM 9.6 01/12/2023 1507    Lipid Panel     Component Value Date/Time   CHOL 151 08/21/2022 1518   TRIG 87 08/21/2022 1518   HDL 53 08/21/2022 1518   CHOLHDL 2.8 08/21/2022 1518   LDLCALC 82 08/21/2022 1518    CBC    Component Value Date/Time   WBC 4.9 12/18/2022 1140   RBC 4.63 12/18/2022 1140   HGB 13.4 12/18/2022 1140   HGB 13.4 08/21/2022 1518   HCT 40.9 12/18/2022 1140   HCT 40.9 08/21/2022 1518   PLT 200 12/18/2022 1140   PLT 230 08/21/2022 1518   MCV 88.3 12/18/2022 1140   MCV 87 08/21/2022 1518   MCH 28.9 12/18/2022 1140   MCHC 32.8 12/18/2022 1140   RDW 12.4 12/18/2022 1140   RDW 12.5 08/21/2022 1518   LYMPHSABS 2.0 12/18/2022 1140   MONOABS 0.3 12/18/2022 1140   EOSABS 0.1 12/18/2022 1140   BASOSABS 0.1 12/18/2022 1140    Hgb A1C Lab Results  Component Value Date   HGBA1C 5.5 08/21/2022            Assessment & Plan:  Assessment and Plan    Mid-back pain Mid-back pain with  leg numbness and shooting pains. Differential includes early arthritis or degenerative facet arthropathy. Focus on symptomatic relief. - Prescribed naproxen 375 mg twice daily  x 1 week with food. - Prescribed methocarbamol 500 mg three times daily, or at bedtime if working during the day as this may cause sedation. - Recommended heating pad use. -Recommend massage or chiropractic care. - Instructed to report worsening or persistent symptoms for potential imaging.        RTC in 2 months for your annual exam Nicki Reaper, NP

## 2023-06-01 NOTE — Patient Instructions (Signed)

## 2023-06-09 ENCOUNTER — Other Ambulatory Visit: Payer: Self-pay | Admitting: Internal Medicine

## 2023-06-11 NOTE — Telephone Encounter (Signed)
 Requested medications are due for refill today.  yes  Requested medications are on the active medications list.  yes  Last refill. 06/01/2023 #14 0 rf  Future visit scheduled.   yes  Notes to clinic.  Pt was to take this medication for 7 days only.    Requested Prescriptions  Pending Prescriptions Disp Refills   naproxen (NAPROSYN) 375 MG tablet [Pharmacy Med Name: NAPROXEN 375MG  TABLETS] 14 tablet 0    Sig: TAKE 1 TABLET(375 MG) BY MOUTH TWICE DAILY WITH A MEAL     Analgesics:  NSAIDS Failed - 06/11/2023  1:15 PM      Failed - Manual Review: Labs are only required if the patient has taken medication for more than 8 weeks.      Passed - Cr in normal range and within 360 days    Creatinine, Ser  Date Value Ref Range Status  01/12/2023 0.75 0.57 - 1.00 mg/dL Final         Passed - HGB in normal range and within 360 days    Hemoglobin  Date Value Ref Range Status  12/18/2022 13.4 12.0 - 15.0 g/dL Final  16/12/9602 54.0 11.1 - 15.9 g/dL Final         Passed - PLT in normal range and within 360 days    Platelets  Date Value Ref Range Status  12/18/2022 200 150 - 400 K/uL Final  08/21/2022 230 150 - 450 x10E3/uL Final         Passed - HCT in normal range and within 360 days    HCT  Date Value Ref Range Status  12/18/2022 40.9 36.0 - 46.0 % Final   Hematocrit  Date Value Ref Range Status  08/21/2022 40.9 34.0 - 46.6 % Final         Passed - eGFR is 30 or above and within 360 days    eGFR  Date Value Ref Range Status  01/12/2023 118 >59 mL/min/1.73 Final         Passed - Patient is not pregnant      Passed - Valid encounter within last 12 months    Recent Outpatient Visits           7 months ago Palpitations   [place] [facility] [place], Salvadore Oxford, NP   9 months ago Anxiety and depression   [place] [place], Salvadore Oxford, NP   10 months ago Encounter for general adult medical examination with abnormal findings   [facility] [place], Salvadore Oxford, NP       Future Appointments             In 1 month Charlsie Quest, NP [facility] [place]   In 1 month [place], Salvadore Oxford, NP  [facility]   In 2 months Willeen Niece, MD [facility]

## 2023-07-17 ENCOUNTER — Encounter: Payer: Self-pay | Admitting: Cardiology

## 2023-07-17 ENCOUNTER — Ambulatory Visit: Payer: BC Managed Care – PPO | Attending: Cardiology | Admitting: Cardiology

## 2023-07-17 VITALS — BP 102/64 | HR 86 | Ht 66.0 in | Wt 133.6 lb

## 2023-07-17 DIAGNOSIS — R002 Palpitations: Secondary | ICD-10-CM

## 2023-07-17 DIAGNOSIS — J4520 Mild intermittent asthma, uncomplicated: Secondary | ICD-10-CM | POA: Diagnosis not present

## 2023-07-17 DIAGNOSIS — I4711 Inappropriate sinus tachycardia, so stated: Secondary | ICD-10-CM | POA: Diagnosis not present

## 2023-07-17 DIAGNOSIS — R0602 Shortness of breath: Secondary | ICD-10-CM

## 2023-07-17 DIAGNOSIS — R0789 Other chest pain: Secondary | ICD-10-CM

## 2023-07-17 NOTE — Progress Notes (Signed)
 Cardiology Office Note:  .   Date:  07/17/2023  ID:  Cindy Ryan, DOB 01/25/2006, MRN [id number] PCP: Carollynn Cirri, NP  [facility] Providers Cardiologist:  Constancia Delton, MD    History of Present Illness: Cindy Ryan is a 18 y.o. female with a past medical history of palpitations, inappropriate sinus tachycardia, uncomplicated asthma, who is here today for follow-up.   Was first evaluated in clinic 12/01/2022 by Dr.Agbor-Etang after being referred by her PCP for palpitations.  She wore cardiac monitor 10/2022 which showed an average heart rate of 73 bpm ranging from 42 bpm to 200 bpm.  No significant arrhythmias, no atrial fibrillation.  She had previous VT and ventricular ectopy.  She reported palpitations occasionally worse with exertion that could occur randomly.  She did notice heart rates up to 200 bpm was noted that she had asthma with frequent attacks symptoms exercise-induced.  She was prescribed propranolol  by her PCP but the patient was worried about taking beta-blocker therapy with having airway disease and frequent asthma attacks.  She was started on verapamil  120 mg daily referred to pulmonary medicine.  On 04/14/2022 she notify the office of several episodes of slow heart rate 30-44 bpm with associated fatigue.  Subsequently the medication had to be discontinued.   She was last seen in clinic 04/17/2023 5.  Reports she been doing well overall with occasional intermittent palpitations.  States that she had been taking her medication prior to working out 1 to 2 hours.  She reports no increase in her fluid and sodium intake as she does little less than a gallon of water per day.  She was encouraged to continue with compression therapy and as needed propranolol .  She returns to clinic today accompanied by her mother overall she states that she has been doing well from a cardiac perspective.  She denies any chest pain or shortness of breath occasional palpitations and occasional  lightheadedness when she stands.  She recently was evaluated by her PCP having hurt her back.  She denies maintaining hydration and wearing compression therapy as needed.  States that she occasionally takes her propranolol  daily and sometimes 3 days a week.  Is not related to symptoms.  Denies any hospitalizations or visits to the emergency department.  ROS: 10 point review of systems has been reviewed and considered negative except what is listed in the HPI  Studies Reviewed: Aaron Aas   EKG Interpretation Date/Time:  Tuesday July 17 2023 11:24:23 EDT Ventricular Rate:  86 PR Interval:  134 QRS Duration:  78 QT Interval:  360 QTC Calculation: 430 R Axis:   67  Text Interpretation: Normal sinus rhythm with sinus arrhythmia Nonspecific T wave abnormality unchanged When compared with ECG of 01-Dec-2022 11:37, No significant change since last tracing Confirmed by Ronald Cockayne ([id number]) on 07/17/2023 11:29:37 AM    2D echo 02/01/2023 1. Left ventricular ejection fraction, by estimation, is 55 to 60%. The  left ventricle has normal function. The left ventricle has no regional  wall motion abnormalities. Left ventricular diastolic parameters were  normal.   2. Right ventricular systolic function is normal. The right ventricular  size is normal.   3. The mitral valve is normal in structure. Mild mitral valve  regurgitation. No evidence of mitral stenosis.   4. The aortic valve has an indeterminant number of cusps. Aortic valve  regurgitation is not visualized. No aortic stenosis is present.   5. The inferior vena cava is normal in size  with greater than 50%  respiratory variability, suggesting right atrial pressure of 3 mmHg.  Risk Assessment/Calculations:             Physical Exam:   VS:  BP 102/64   Pulse 86   Ht 5\' 6"  (1.676 m)   Wt 133 lb 9.6 oz (60.6 kg)   SpO2 99%   BMI 21.56 kg/m    Wt Readings from Last 3 Encounters:  07/17/23 133 lb 9.6 oz (60.6 kg) (59%, Z= 0.24)*  06/01/23 130  lb 12.8 oz (59.3 kg) (55%, Z= 0.13)*  04/17/23 135 lb 12.8 oz (61.6 kg) (64%, Z= 0.35)*   * Growth percentiles are based on CDC (Girls, 2-20 Years) data.    GEN: Well nourished, well developed in no acute distress NECK: No JVD; No carotid bruits CARDIAC: RRR, no murmurs, rubs, gallops RESPIRATORY:  Clear to auscultation without rales, wheezing or rhonchi  ABDOMEN: Soft, non-tender, non-distended EXTREMITIES:  No edema; No deformity   ASSESSMENT AND PLAN: .   Palpitations/inappropriate sinus tachycardia/occasional lightheadedness her symptoms have been well-controlled with as needed propranolol  10 mg.  EKG today reveals sinus rhythm sinus arrhythmia rate of 86 with no significant changes noted.  No ischemic changes noted.  She is to continue with her propranolol  as needed daily or up to even 3 times daily if worsening of symptoms.  She has been encouraged to remain hydrated and continue with compression therapy.   Asthma where she continues on Singulair  and inhalers.  Encouraged to continue to follow-up with pulmonary.  Atypical chest pain with shortness of breath that has improved over time.  No ischemic changes noted on EKG.       Dispo: Patient return to clinic to see me/APP in 6 months or sooner if needed  Signed, Dorraine Ellender, NP

## 2023-07-17 NOTE — Patient Instructions (Signed)
 Medication Instructions:  Your physician recommends that you continue on your current medications as directed. Please refer to the Current Medication list given to you today.  *If you need a refill on your cardiac medications before your next appointment, please call your pharmacy*  Lab Work: No labs ordered today  If you have labs (blood work) drawn today and your tests are completely normal, you will receive your results only by: MyChart Message (if you have MyChart) OR A paper copy in the mail If you have any lab test that is abnormal or we need to change your treatment, we will call you to review the results.  Testing/Procedures: No test ordered today   Follow-Up: At [facility], you and your health needs are our priority.  As part of our continuing mission to provide you with exceptional heart care, our providers are all part of one team.  This team includes your primary Cardiologist (physician) and Advanced Practice Providers or APPs (Physician Assistants and Nurse Practitioners) who all work together to provide you with the care you need, when you need it.  Your next appointment:   6 month(s)  Provider:   You may see Constancia Delton, MD or one of the following Advanced Practice Providers on your designated Care Team:   Laneta Pintos, NP Gildardo Labrador, PA-C Varney Gentleman, PA-C Cadence Gennaro Khat, PA-C Ronald Cockayne, NP Morey Ar, NP {If no MD populates, click here to update Cardiologist or EP   DO

## 2023-08-01 ENCOUNTER — Encounter: Payer: Self-pay | Admitting: Podiatry

## 2023-08-01 ENCOUNTER — Ambulatory Visit (INDEPENDENT_AMBULATORY_CARE_PROVIDER_SITE_OTHER)

## 2023-08-01 ENCOUNTER — Ambulatory Visit (INDEPENDENT_AMBULATORY_CARE_PROVIDER_SITE_OTHER): Admitting: Podiatry

## 2023-08-01 DIAGNOSIS — M2011 Hallux valgus (acquired), right foot: Secondary | ICD-10-CM | POA: Diagnosis not present

## 2023-08-01 NOTE — Progress Notes (Signed)
 Subjective:  Patient ID: Cindy Ryan, female    DOB: Dec 29, 2005,  MRN: [id number] HPI Chief Complaint  Patient presents with   Foot Pain    "I have a bunion on my foot." N - bunion pain L - right hallux D - 5-6 years O - gradually worse C - sharp pain, tender, ache, sore A - standing a lot T - Tylenol  occasionally    18 y.o. female presents with the above complaint.   ROS: Denies fever chills nausea vomiting muscle aches pains calf pain back pain chest pain shortness of breath.  She states that she has had foot pain for the past several months has tried over-the-counter anti-inflammatories also changing her shoe gear all to no avail.  Past Medical History:  Diagnosis Date   Asthma    SVT (supraventricular tachycardia) (HCC)    Past Surgical History:  Procedure Laterality Date   FOOT SURGERY Right    WISDOM TOOTH EXTRACTION      Current Outpatient Medications:    Adapalene  (DIFFERIN ) 0.3 % gel, Apply 1 application  topically at bedtime., Disp: 45 g, Rfl: 5   albuterol  (VENTOLIN  HFA) 108 (90 Base) MCG/ACT inhaler, Inhale 2 puffs into the lungs every 6 (six) hours as needed., Disp: 8 g, Rfl: 2   clindamycin  (CLEOCIN -T) 1 % lotion, Apply to face every morning for acne., Disp: 60 mL, Rfl: 5   Dapsone  (ACZONE ) 7.5 % GEL, Apply to face every morning., Disp: 90 g, Rfl: 5   fluticasone  (FLONASE ) 50 MCG/ACT nasal spray, SHAKE LIQUID AND USE 2 SPRAYS IN EACH NOSTRIL DAILY, Disp: 16 g, Rfl: 5   montelukast  (SINGULAIR ) 10 MG tablet, TAKE 1 TABLET(10 MG) BY MOUTH AT BEDTIME, Disp: 90 tablet, Rfl: 1   propranolol  (INDERAL ) 10 MG tablet, Take 1 tablet (10 mg total) by mouth daily as needed., Disp: 90 tablet, Rfl: 3   tretinoin  (RETIN-A ) 0.05 % cream, Apply a pea-sized amount to face nightly as tolerated., Disp: 45 g, Rfl: 3   methocarbamol  (ROBAXIN ) 500 MG tablet, Take 1 tablet (500 mg total) by mouth every 8 (eight) hours as needed for muscle spasms. (Patient not taking: Reported on  08/01/2023), Disp: 20 tablet, Rfl: 0   naproxen  (NAPROSYN ) 375 MG tablet, Take 1 tablet (375 mg total) by mouth 2 (two) times daily with a meal. (Patient not taking: Reported on 08/01/2023), Disp: 14 tablet, Rfl: 0 No current facility-administered medications for this visit.  Facility-Administered Medications Ordered in Other Visits:    albuterol  (PROVENTIL ) (2.5 MG/3ML) 0.083% nebulizer solution 2.5 mg, 2.5 mg, Nebulization, Once, Vergia Glasgow, MD  No Known Allergies Review of Systems Objective:  There were no vitals filed for this visit.  General: Well developed, nourished, in no acute distress, alert and oriented x3   Dermatological: Skin is warm, dry and supple bilateral. Nails x 10 are well maintained; remaining integument appears unremarkable at this time. There are no open sores, no preulcerative lesions, no rash or signs of infection present.  Vascular: Dorsalis Pedis artery and Posterior Tibial artery pedal pulses are 2/4 bilateral with immedate capillary fill time. Pedal hair growth present. No varicosities and no lower extremity edema present bilateral.   Neruologic: Grossly intact via light touch bilateral. Vibratory intact via tuning fork bilateral. Protective threshold with Semmes Wienstein monofilament intact to all pedal sites bilateral. Patellar and Achilles deep tendon reflexes 2+ bilateral. No Babinski or clonus noted bilateral.   Musculoskeletal: No gross boney pedal deformities bilateral. No pain, crepitus, or limitation noted  with foot and ankle range of motion bilateral. Muscular strength 5/5 in all groups tested bilateral.  Gait: Unassisted, Nonantalgic.    Radiographs:  Radiographs taken today demonstrate osseously mature individual with good bone mineralization.  She has an increase in the first intermetatarsal angle greater than 12 degrees with a hallux abductus angle greater than 22 degrees.  There is no dorsal spurring noted.  No joint space narrowing visible.   Tibial sesamoid position is 4.  Assessment & Plan:   Assessment: Hallux abductovalgus deformity right foot  Plan: Discussed etiology pathology conservative versus surgical therapies at this point we consented her for an [facility] bunion repair of the right foot with screw fixation.  She understands that she is going to be out of school for a while for this and out of work as well.  We did discuss the possible complications which may include but are not limited to postop pain bleeding swelling infection recurrence need for further surgery overcorrection under correction loss of digit loss of limb loss of life.  We discussed center as well as anesthesia and I will follow-up with her in the near future for surgical     Jeret Goyer T. [place], [place]

## 2023-08-02 ENCOUNTER — Telehealth: Payer: Self-pay | Admitting: Podiatry

## 2023-08-02 NOTE — Telephone Encounter (Signed)
 Received surgical consent form.  Left message for pt to call me back to get her surgery scheduled.

## 2023-08-06 ENCOUNTER — Encounter: Payer: Self-pay | Admitting: Internal Medicine

## 2023-08-06 ENCOUNTER — Ambulatory Visit (INDEPENDENT_AMBULATORY_CARE_PROVIDER_SITE_OTHER): Payer: Self-pay | Admitting: Internal Medicine

## 2023-08-06 ENCOUNTER — Ambulatory Visit
Admission: RE | Admit: 2023-08-06 | Discharge: 2023-08-06 | Disposition: A | Discharge status: Home / Self Care | Source: Ambulatory Visit | Attending: Internal Medicine | Admitting: Internal Medicine

## 2023-08-06 VITALS — BP 102/64 | Ht 66.0 in | Wt 133.0 lb

## 2023-08-06 DIAGNOSIS — Z0001 Encounter for general adult medical examination with abnormal findings: Secondary | ICD-10-CM

## 2023-08-06 DIAGNOSIS — M546 Pain in thoracic spine: Secondary | ICD-10-CM

## 2023-08-06 DIAGNOSIS — Z136 Encounter for screening for cardiovascular disorders: Secondary | ICD-10-CM | POA: Diagnosis not present

## 2023-08-06 DIAGNOSIS — G8929 Other chronic pain: Secondary | ICD-10-CM | POA: Insufficient documentation

## 2023-08-06 DIAGNOSIS — M898X1 Other specified disorders of bone, shoulder: Secondary | ICD-10-CM | POA: Insufficient documentation

## 2023-08-06 DIAGNOSIS — R739 Hyperglycemia, unspecified: Secondary | ICD-10-CM

## 2023-08-06 NOTE — Patient Instructions (Signed)
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
## 2023-08-07 ENCOUNTER — Ambulatory Visit: Payer: Self-pay | Admitting: Internal Medicine

## 2023-08-07 LAB — LIPID PANEL
Cholesterol: 159 mg/dL (ref <170)
HDL: 61 mg/dL (ref >45)
LDL Cholesterol (Calc): 81 mg/dL (ref <110)
Non-HDL Cholesterol (Calc): 98 mg/dL (ref <120)
Total CHOL/HDL Ratio: 2.6 (calc) (ref <5.0)
Triglycerides: 83 mg/dL (ref <90)

## 2023-08-07 LAB — COMPREHENSIVE METABOLIC PANEL WITH GFR
AG Ratio: 2.9 (calc) — ABNORMAL HIGH (ref 1.0–2.5)
ALT: 12 U/L (ref 5–32)
AST: 14 U/L (ref 12–32)
Albumin: 5.2 g/dL — ABNORMAL HIGH (ref 3.6–5.1)
Alkaline phosphatase (APISO): 51 U/L (ref 36–128)
BUN: 11 mg/dL (ref 7–20)
CO2: 26 mmol/L (ref 20–32)
Calcium: 9.9 mg/dL (ref 8.9–10.4)
Chloride: 102 mmol/L (ref 98–110)
Creat: 0.79 mg/dL (ref 0.50–0.96)
Globulin: 1.8 g/dL — ABNORMAL LOW (ref 2.0–3.8)
Glucose, Bld: 98 mg/dL (ref 65–139)
Potassium: 3.8 mmol/L (ref 3.8–5.1)
Sodium: 138 mmol/L (ref 135–146)
Total Bilirubin: 0.5 mg/dL (ref 0.2–1.1)
Total Protein: 7 g/dL (ref 6.3–8.2)
eGFR: 110 mL/min/{1.73_m2} (ref >=60)

## 2023-08-07 LAB — ANA: Anti Nuclear Antibody (ANA): NEGATIVE

## 2023-08-07 LAB — CBC
HCT: 40.5 % (ref 35.0–45.0)
Hemoglobin: 13.5 g/dL (ref 11.7–15.5)
MCH: 29 pg (ref 27.0–33.0)
MCHC: 33.3 g/dL (ref 32.0–36.0)
MCV: 86.9 fL (ref 80.0–100.0)
MPV: 11.5 fL (ref 7.5–12.5)
Platelets: 231 10*3/uL (ref 140–400)
RBC: 4.66 10*6/uL (ref 3.80–5.10)
RDW: 12.3 % (ref 11.0–15.0)
WBC: 6.9 10*3/uL (ref 3.8–10.8)

## 2023-08-07 LAB — C-REACTIVE PROTEIN: CRP: <3 mg/L (ref <8.0)

## 2023-08-07 LAB — HEMOGLOBIN A1C
Hgb A1c MFr Bld: 5.5 % (ref <5.7)
Mean Plasma Glucose: 111 mg/dL
eAG (mmol/L): 6.2 mmol/L

## 2023-08-07 LAB — SEDIMENTATION RATE: Sed Rate: 2 mm/h (ref 0–20)

## 2023-09-04 ENCOUNTER — Ambulatory Visit: Payer: 59 | Admitting: Dermatology

## 2023-09-04 DIAGNOSIS — L700 Acne vulgaris: Secondary | ICD-10-CM

## 2023-09-04 MED ORDER — TRETINOIN 0.05 % EX CREA: TOPICAL_CREAM | CUTANEOUS | 3 refills | Status: AC

## 2023-09-04 MED ORDER — DAPSONE 7.5 % EX GEL
CUTANEOUS | 5 refills | Status: AC
Start: 2023-09-04 — End: ?

## 2023-09-04 NOTE — Progress Notes (Signed)
   Follow-Up Visit   Subjective  Cindy Ryan is a 18 y.o. female who presents for the following: Acne f/u, patient using tretinoin  0.05% cream nightly and dapsone  7.5% gel couple times a week. Patient states acne has been well controlled.   The patient has spots, moles and lesions to be evaluated, some may be new or changing and the patient may have concern these could be cancer.   The following portions of the chart were reviewed this encounter and updated as appropriate: medications, allergies, medical history  Review of Systems:  No other skin or systemic complaints except as noted in HPI or Assessment and Plan.  Objective  Well appearing patient in no apparent distress; mood and affect are within normal limits.  A focused examination was performed of the following areas: Face  Relevant exam findings are noted in the Assessment and Plan.    Assessment & Plan   ACNE VULGARIS Exam: Few closed comedones at forehead, cheeks.   Chronic condition with duration or expected duration over one year. Currently well-controlled.    Treatment Plan: Continue tretinoin  0.05% cream Apply a pea-sized amount to face every night as tolerated.  Continue dapsone  7.5% gel Apply to face every morning prn flares.   Topical retinoid medications like tretinoin /Retin-A , adapalene /Differin , tazarotene/Fabior, and Epiduo/Epiduo Forte can cause dryness and irritation when first started. Only apply a pea-sized amount to the entire affected area. Avoid applying it around the eyes, edges of mouth and creases at the nose. If you experience irritation, use a good moisturizer first and/or apply the medicine less often. If you are doing well with the medicine, you can increase how often you use it until you are applying every night. Be careful with sun protection while using this medication as it can make you sensitive to the sun. This medicine should not be used by pregnant women.     ACNE VULGARIS   Related  Medications clindamycin  (CLEOCIN -T) 1 % lotion Apply to face every morning for acne. Adapalene  (DIFFERIN ) 0.3 % gel Apply 1 application  topically at bedtime. Dapsone  (ACZONE ) 7.5 % GEL Apply to face every morning.  Return in 1 year (on 09/03/2024) for w/ Dr. Annette Barters, Acne.  I, Jacquelynn V. Grier Leber, CMA, am acting as scribe for Artemio Larry, MD .   Documentation: I have reviewed the above documentation for accuracy and completeness, and I agree with the above.  Artemio Larry, MD

## 2023-09-04 NOTE — Patient Instructions (Signed)

## 2023-09-14 ENCOUNTER — Other Ambulatory Visit: Payer: Self-pay | Admitting: Internal Medicine

## 2023-09-17 NOTE — Telephone Encounter (Signed)
 Requested Prescriptions  Pending Prescriptions Disp Refills   montelukast  (SINGULAIR ) 10 MG tablet [Pharmacy Med Name: MONTELUKAST  10MG  TABLETS] 90 tablet 1    Sig: TAKE 1 TABLET(10 MG) BY MOUTH AT BEDTIME     Pulmonology:  Leukotriene Inhibitors Passed - 09/17/2023 11:06 AM      Passed - Valid encounter within last 12 months    Recent Outpatient Visits           1 month ago Encounter for general adult medical examination with abnormal findings   [place] [facility] [place], Angeline ORN, NP   3 months ago Acute bilateral thoracic back pain   [facility] [place], Angeline ORN, NP       Future Appointments             In 11 months Jackquline Sawyer, MD [facility]

## 2023-11-26 ENCOUNTER — Ambulatory Visit: Admitting: Dermatology

## 2023-11-29 ENCOUNTER — Ambulatory Visit

## 2023-12-25 ENCOUNTER — Ambulatory Visit: Admitting: Internal Medicine

## 2023-12-28 ENCOUNTER — Encounter: Payer: Self-pay | Admitting: Internal Medicine

## 2023-12-28 ENCOUNTER — Ambulatory Visit: Admitting: Internal Medicine

## 2023-12-28 VITALS — BP 108/68 | Ht 66.0 in | Wt 134.8 lb

## 2023-12-28 DIAGNOSIS — M546 Pain in thoracic spine: Secondary | ICD-10-CM | POA: Diagnosis not present

## 2023-12-28 DIAGNOSIS — Z23 Encounter for immunization: Secondary | ICD-10-CM | POA: Diagnosis not present

## 2023-12-28 DIAGNOSIS — G8929 Other chronic pain: Secondary | ICD-10-CM | POA: Diagnosis not present
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
## 2023-12-28 NOTE — Patient Instructions (Signed)

## 2024-01-14 ENCOUNTER — Ambulatory Visit: Admitting: Cardiology

## 2024-01-21 ENCOUNTER — Encounter: Payer: Self-pay | Admitting: Radiology

## 2024-02-01 ENCOUNTER — Ambulatory Visit: Attending: Cardiology | Admitting: Cardiology

## 2024-02-01 ENCOUNTER — Encounter: Payer: Self-pay | Admitting: Cardiology

## 2024-02-01 VITALS — BP 98/70 | HR 73 | Ht 66.0 in | Wt 131.4 lb

## 2024-02-01 DIAGNOSIS — I4711 Inappropriate sinus tachycardia, so stated: Secondary | ICD-10-CM | POA: Diagnosis not present

## 2024-02-01 DIAGNOSIS — R002 Palpitations: Secondary | ICD-10-CM | POA: Diagnosis not present

## 2024-02-01 DIAGNOSIS — J4520 Mild intermittent asthma, uncomplicated: Secondary | ICD-10-CM | POA: Diagnosis not present

## 2024-02-01 MED ORDER — PROPRANOLOL HCL 10 MG PO TABS
10.0000 mg | ORAL_TABLET | Freq: Every day | ORAL | 3 refills | Status: AC
Start: 2024-02-01 — End: 2025-01-31

## 2024-02-01 NOTE — Progress Notes (Signed)
 Cardiology Office Note   Date:  02/01/2024  ID:  Astria Jordahl, DOB [birth year], MRN [id number] PCP: Antonette Angeline ORN, NP  [facility] Providers Cardiologist:  Redell Cave, MD Cardiology APP:  Gerard Frederick, NP     History of Present Illness Cindy Ryan is a 18 y.o. female with past medical history of palpitations, inappropriate sinus tachycardia, uncomplicated asthma, who is here today for follow-up.   Was first evaluated in clinic 12/01/2022 by Dr.Agbor-Etang after being referred by her PCP for palpitations.  She wore cardiac monitor 10/2022 which showed an average heart rate of 73 bpm ranging from 42 bpm to 200 bpm.  No significant arrhythmias, no atrial fibrillation.  She had previous VT and ventricular ectopy.  She reported palpitations occasionally worse with exertion that could occur randomly.  She did notice heart rates up to 200 bpm was noted that she had asthma with frequent attacks symptoms exercise-induced.  She was prescribed propranolol  by her PCP but the patient was worried about taking beta-blocker therapy with having airway disease and frequent asthma attacks.  She was started on verapamil  120 mg daily referred to pulmonary medicine.  On 04/14/2022 she notify the office of several episodes of slow heart rate 30-44 bpm with associated fatigue.  Subsequently the medication had to be discontinued.  She was seen in clinic 04/17/2023 provide she was doing well with occasional intermittent palpitations.  She been taking her medication prior to working out 1 to 2 hours.  She reported an increase in her fluid and sodium intake is just a little less than a gallon of water per day.  She was encouraged to continue with compression therapy and as needed propranolol .  She was last seen in clinic 6//25 accompanied by mother overall states she was doing well from a cardiac perspective.  She denied any symptoms.  She was recently evaluated by her PCP after an impact.  States that she was  occasionally taking her propranolol  daily.  No changes made to her medication regimen and no further testing that was ordered.   She returns to clinic today stating that she has been doing well from a cardiac perspective.  She denies any chest pain or shortness of breath.  She has been taking the propranolol  daily regimen without any reoccurrence of tachycardia or palpitations.  She has recently followed up with her PCP provider and had labs completed.  She denies any hospitalizations or visits to the emergency department.  ROS: 10 point review of systems has been reviewed and considered negative the exception was been listed in the HPI  Studies Reviewed EKG Interpretation Date/Time:  Friday February 01 2024 13:28:24 EST Ventricular Rate:  73 PR Interval:  150 QRS Duration:  86 QT Interval:  402 QTC Calculation: 442 R Axis:   74  Text Interpretation: Normal sinus rhythm No significant change since last tracing Confirmed by Gerard Frederick ([id number]) on 02/01/2024 1:43:52 PM    2D echo 02/01/2023 1. Left ventricular ejection fraction, by estimation, is 55 to 60%. The  left ventricle has normal function. The left ventricle has no regional  wall motion abnormalities. Left ventricular diastolic parameters were  normal.   2. Right ventricular systolic function is normal. The right ventricular  size is normal.   3. The mitral valve is normal in structure. Mild mitral valve  regurgitation. No evidence of mitral stenosis.   4. The aortic valve has an indeterminant number of cusps. Aortic valve  regurgitation is not visualized. No aortic stenosis is  present.   5. The inferior vena cava is normal in size with greater than 50%  respiratory variability, suggesting right atrial pressure of 3 mmHg.   Risk Assessment/Calculations           Physical Exam VS:  BP 98/70 (BP Location: Left Arm, Patient Position: Sitting, Cuff Size: Normal)   Pulse 73 Comment: 80 oximeter  Ht 5' 6 (1.676 m)   Wt 131  lb 6.4 oz (59.6 kg)   SpO2 95%   BMI 21.21 kg/m        Wt Readings from Last 3 Encounters:  02/01/24 131 lb 6.4 oz (59.6 kg)  12/28/23 134 lb 12.8 oz (61.1 kg)  08/06/23 133 lb (60.3 kg) (58%, Z= 0.21)*   * Growth percentiles are based on CDC (Girls, 2-20 Years) data.    GEN: Well nourished, well developed in no acute distress NECK: No JVD; No carotid bruits CARDIAC: RRR, no murmurs, rubs, gallops RESPIRATORY:  Clear to auscultation without rales, wheezing or rhonchi  ABDOMEN: Soft, non-tender, non-distended EXTREMITIES:  No edema; No deformity   ASSESSMENT AND PLAN Palpitations/inappropriate sinus tachycardia with no reoccurrence since taking propranolol  10 mg daily.  EKG today reveals sinus rhythm with rate of 73 with no acute ischemic changes noted.  She has been continued on her propranolol  10 mg daily with an additional dose as needed for worsening symptoms.  She has been encouraged to maintain adequate hydration and continue with compression therapy.  No further ischemic evaluation is needed at this time.  Asthma where she continues to be on Singulair  and inhalers.  She is encouraged to continue to maintain follow-up with pulmonary.       Dispo: Patient to return to clinic to see MD/APP in 11 to 12 months or sooner if needed for further evaluation  Signed, Keiaira Donlan, NP

## 2024-02-01 NOTE — Patient Instructions (Signed)
 Medication Instructions:  Your physician recommends the following medication changes.  START TAKING: Propranolol  10 mg daily - may take additional tablet for tachycardia   *If you need a refill on your cardiac medications before your next appointment, please call your pharmacy*  Lab Work: No labs ordered today  If you have labs (blood work) drawn today and your tests are completely normal, you will receive your results only by: MyChart Message (if you have MyChart) OR A paper copy in the mail If you have any lab test that is abnormal or we need to change your treatment, we will call you to review the results.  Testing/Procedures: No test ordered today   Follow-Up: At [facility], you and your health needs are our priority.  As part of our continuing mission to provide you with exceptional heart care, our providers are all part of one team.  This team includes your primary Cardiologist (physician) and Advanced Practice Providers or APPs (Physician Assistants and Nurse Practitioners) who all work together to provide you with the care you need, when you need it.  Your next appointment:   12 month(s)  Provider:   You may see Redell Cave, MD or one of the following Advanced Practice Providers on your designated Care Team:   Tylene Lunch, NP

## 2024-02-07 ENCOUNTER — Ambulatory Visit: Admitting: Internal Medicine

## 2024-02-12 ENCOUNTER — Ambulatory Visit: Admitting: Dermatology

## 2024-02-13 ENCOUNTER — Ambulatory Visit (INDEPENDENT_AMBULATORY_CARE_PROVIDER_SITE_OTHER): Admitting: Orthopedic Surgery

## 2024-02-13 ENCOUNTER — Other Ambulatory Visit (INDEPENDENT_AMBULATORY_CARE_PROVIDER_SITE_OTHER): Payer: Self-pay

## 2024-02-13 VITALS — BP 98/68 | Ht 66.0 in | Wt 135.0 lb

## 2024-02-13 DIAGNOSIS — M546 Pain in thoracic spine: Secondary | ICD-10-CM | POA: Diagnosis not present

## 2024-02-13 DIAGNOSIS — G8929 Other chronic pain: Secondary | ICD-10-CM

## 2024-02-13 NOTE — Progress Notes (Signed)
 Orthopedic Spine Surgery Office Note  Assessment: Patient is a 18 y.o. female with thoracic back pain.  No radicular symptoms.  No symptoms or signs of myelopathy.   Plan: -Explained that initially conservative treatment is tried as a significant number of patients may experience relief with these treatment modalities. Discussed that the conservative treatments include:  -activity modification  -physical therapy  -over the counter pain medications  -medrol dosepak  -lumbar steroid injections -Patient has tried Tylenol , ibuprofen, oral steroids  -Recommended use tylenol  up to 1000mg  TID. Discussed a home exercise program of core strengthening that she should work on. Can use lidocaine patches as well -If she is not doing any better at our next visit, will order a MRI of the thoracic spine to evaluate further -Patient should return to office in 6 weeks, x-rays at next visit: none   Patient expressed understanding of the plan and all questions were answered to the patient's satisfaction.   ___________________________________________________________________________   History:  Patient is a 18 y.o. female who presents today for thoracic spine.  Patient has had a little longer year of thoracic back pain.  She initially felt the pain in her thoracic spine and it went down into her bilateral legs.  The leg pain has resolved.  She now just has pain in the lower thoracic region.  It does not radiate into the chest wall.  She notes the pain is worse with at her job.  She works a producer, television/film/video job.  She also says it is worse if she is bending.  She does not have the pain if she is sitting, laying, or resting.  There was no trauma or injury that preceded the onset of the pain.   Weakness: Denies Symptoms of imbalance: Denies Paresthesias and numbness: Denies Bowel or bladder incontinence: Denies Saddle anesthesia: Denies  Treatments tried: Tylenol , ibuprofen, oral steroids  Review of systems: Denies  fevers and chills, night sweats, unexplained weight loss, history of cancer, pain that wakes her at night  Past medical history: Asthma  Allergies: NKDA  Past surgical history:  Right foot bone spur removal  Social history: Denies use of nicotine product (smoking, vaping, patches, smokeless) Alcohol use: denies Denies recreational drug use   Physical Exam:  BMI of 27.8  General: no acute distress, appears stated age Neurologic: alert, answering questions appropriately, following commands Respiratory: unlabored breathing on room air, symmetric chest rise Psychiatric: appropriate affect, normal cadence to speech   MSK (spine):  -Strength exam      Left  Right EHL    5/5  5/5 TA    5/5  5/5 GSC    5/5  5/5 Knee extension  5/5  5/5 Hip flexion   5/5  5/5  -Sensory exam    Sensation intact to light touch in L3-S1 nerve distributions of bilateral lower extremities  -Achilles DTR: 2/4 on the left, 2/4 on the right -Patellar tendon DTR: 2/4 on the left, 2/4 on the right  -Straight leg raise: negative bilaterally -Clonus: no beats bilaterally -Negative Romberg  -Left hip exam: No pain through range of motion -Right hip exam: No pain through range of motion  Imaging: XRs of the thoracic spine from 02/13/2024 were independently reviewed and interpreted, showing no fracture or dislocation.  No significant degenerative changes.  No spondylolisthesis seen.   Patient name: Cindy Ryan Patient MRN: [id number] Date of visit: 02/13/24

## 2024-04-10 ENCOUNTER — Other Ambulatory Visit: Payer: Self-pay | Admitting: Internal Medicine

## 2024-04-10 NOTE — Telephone Encounter (Signed)
 Requested Prescriptions  Pending Prescriptions Disp Refills   montelukast  (SINGULAIR ) 10 MG tablet [Pharmacy Med Name: MONTELUKAST  10MG  TABLETS] 90 tablet 1    Sig: TAKE 1 TABLET(10 MG) BY MOUTH AT BEDTIME     Pulmonology:  Leukotriene Inhibitors Passed - 04/10/2024  2:27 PM      Passed - Valid encounter within last 12 months    Recent Outpatient Visits           3 months ago Chronic midline thoracic back pain   [place] [facility] [place], Angeline ORN, NP   8 months ago Encounter for general adult medical examination with abnormal findings   [place] [facility] [place], Angeline ORN, NP   10 months ago Acute bilateral thoracic back pain   [place] [facility] [place], Angeline ORN, NP       Future Appointments             In 5 months Jackquline Sawyer, MD [facility]

## 2024-05-01 ENCOUNTER — Ambulatory Visit: Admitting: Student in an Organized Health Care Education/Training Program

## 2024-05-09 ENCOUNTER — Ambulatory Visit: Admitting: Cardiology

## 2024-09-09 ENCOUNTER — Ambulatory Visit: Admitting: Dermatology
